# Patient Record
Sex: Male | Born: 1945 | Race: White | Hispanic: Yes | Marital: Married | Smoking: Never smoker
Health system: Southern US, Community
[De-identification: ages and names within clinical notes are randomized; demographics above are authoritative.]

## PROBLEM LIST (undated history)

## (undated) DIAGNOSIS — I10 Essential (primary) hypertension: Secondary | ICD-10-CM

## (undated) DIAGNOSIS — E7800 Pure hypercholesterolemia, unspecified: Secondary | ICD-10-CM

## (undated) DIAGNOSIS — R339 Retention of urine, unspecified: Secondary | ICD-10-CM

---

## 2019-01-09 ENCOUNTER — Emergency Department (HOSPITAL_BASED_OUTPATIENT_CLINIC_OR_DEPARTMENT_OTHER)
Admission: EM | Admit: 2019-01-09 | Discharge: 2019-01-09 | Disposition: A | Discharge status: Home / Self Care | Payer: Medicare Other | Attending: Emergency Medicine | Admitting: Emergency Medicine

## 2019-01-09 ENCOUNTER — Encounter (HOSPITAL_BASED_OUTPATIENT_CLINIC_OR_DEPARTMENT_OTHER): Payer: Self-pay | Admitting: Emergency Medicine

## 2019-01-09 ENCOUNTER — Emergency Department (HOSPITAL_COMMUNITY): Payer: Medicare Other

## 2019-01-09 ENCOUNTER — Emergency Department (HOSPITAL_BASED_OUTPATIENT_CLINIC_OR_DEPARTMENT_OTHER): Payer: Medicare Other

## 2019-01-09 ENCOUNTER — Other Ambulatory Visit: Payer: Self-pay

## 2019-01-09 DIAGNOSIS — Z79899 Other long term (current) drug therapy: Secondary | ICD-10-CM | POA: Insufficient documentation

## 2019-01-09 DIAGNOSIS — I10 Essential (primary) hypertension: Secondary | ICD-10-CM | POA: Diagnosis not present

## 2019-01-09 DIAGNOSIS — M5441 Lumbago with sciatica, right side: Secondary | ICD-10-CM | POA: Diagnosis not present

## 2019-01-09 DIAGNOSIS — R338 Other retention of urine: Secondary | ICD-10-CM | POA: Insufficient documentation

## 2019-01-09 DIAGNOSIS — M545 Low back pain: Secondary | ICD-10-CM | POA: Diagnosis present

## 2019-01-09 HISTORY — DX: Pure hypercholesterolemia, unspecified: E78.00

## 2019-01-09 HISTORY — DX: Essential (primary) hypertension: I10

## 2019-01-09 HISTORY — DX: Retention of urine, unspecified: R33.9

## 2019-01-09 LAB — URINALYSIS, ROUTINE W REFLEX MICROSCOPIC
Bilirubin Urine: NEGATIVE
Glucose, UA: NEGATIVE mg/dL
Hgb urine dipstick: NEGATIVE
Ketones, ur: NEGATIVE mg/dL
Leukocytes,Ua: NEGATIVE
Nitrite: NEGATIVE
Protein, ur: NEGATIVE mg/dL
Specific Gravity, Urine: 1.01 (ref 1.005–1.030)
pH: 7.5 (ref 5.0–8.0)

## 2019-01-09 LAB — BASIC METABOLIC PANEL
Anion gap: 7 (ref 5–15)
BUN: 13 mg/dL (ref 8–23)
CO2: 25 mmol/L (ref 22–32)
Calcium: 8.2 mg/dL — ABNORMAL LOW (ref 8.9–10.3)
Chloride: 109 mmol/L (ref 98–111)
Creatinine, Ser: 1.06 mg/dL (ref 0.61–1.24)
GFR calc Af Amer: >60 mL/min (ref >60)
GFR calc non Af Amer: >60 mL/min (ref >60)
Glucose, Bld: 101 mg/dL — ABNORMAL HIGH (ref 70–99)
Potassium: 3.6 mmol/L (ref 3.5–5.1)
Sodium: 141 mmol/L (ref 135–145)

## 2019-01-09 LAB — CBC WITH DIFFERENTIAL/PLATELET
Abs Immature Granulocytes: 0.01 10*3/uL (ref 0.00–0.07)
Basophils Absolute: 0 10*3/uL (ref 0.0–0.1)
Basophils Relative: 0 %
Eosinophils Absolute: 0.2 10*3/uL (ref 0.0–0.5)
Eosinophils Relative: 3 %
HCT: 40.3 % (ref 39.0–52.0)
Hemoglobin: 13.4 g/dL (ref 13.0–17.0)
Immature Granulocytes: 0 %
Lymphocytes Relative: 18 %
Lymphs Abs: 1.3 10*3/uL (ref 0.7–4.0)
MCH: 29.8 pg (ref 26.0–34.0)
MCHC: 33.3 g/dL (ref 30.0–36.0)
MCV: 89.8 fL (ref 80.0–100.0)
Monocytes Absolute: 0.6 10*3/uL (ref 0.1–1.0)
Monocytes Relative: 9 %
Neutro Abs: 5 10*3/uL (ref 1.7–7.7)
Neutrophils Relative %: 70 %
Platelets: 249 10*3/uL (ref 150–400)
RBC: 4.49 MIL/uL (ref 4.22–5.81)
RDW: 12.1 % (ref 11.5–15.5)
WBC: 7.1 10*3/uL (ref 4.0–10.5)
nRBC: 0 % (ref 0.0–0.2)

## 2019-01-09 MED ORDER — KETOROLAC TROMETHAMINE 15 MG/ML IJ SOLN
15.0000 mg | Freq: Once | INTRAMUSCULAR | Status: AC
  Administered 2019-01-09: 20:00:00 15 mg via INTRAVENOUS
  Filled 2019-01-09: qty 1

## 2019-01-09 MED ORDER — DEXAMETHASONE SODIUM PHOSPHATE 10 MG/ML IJ SOLN
10.0000 mg | Freq: Once | INTRAMUSCULAR | Status: AC
  Administered 2019-01-09: 23:00:00 10 mg via INTRAVENOUS
  Filled 2019-01-09: qty 1

## 2019-01-09 MED ORDER — FAMOTIDINE 20 MG PO TABS: 20.0000 mg | ORAL_TABLET | Freq: Every day | ORAL | 0 refills | Status: AC

## 2019-01-09 MED ORDER — METHYLPREDNISOLONE 4 MG PO TBPK: ORAL_TABLET | ORAL | 0 refills | Status: AC

## 2019-01-09 MED ORDER — MORPHINE SULFATE (PF) 4 MG/ML IV SOLN
4.0000 mg | Freq: Once | INTRAVENOUS | Status: AC
  Administered 2019-01-09: 4 mg via INTRAVENOUS
  Filled 2019-01-09: qty 1

## 2019-01-09 NOTE — ED Provider Notes (Signed)
[facility] EMERGENCY DEPARTMENT Provider Note   CSN: [id number] Arrival date & time: 01/09/19  1344    History   Chief Complaint Chief Complaint  Patient presents with   Back Pain    HPI Jason Dalton is a 73 y.o. male with PMHx HTN who presents to the ED complaining of sudden onset, intermittent, bilateral lower back pain radiating into right leg x 1 week. Pt also endorses urinary retention since onset of pain. He states he was mowing his lawn when he felt a sudden "twinge" in his lower back; he has been dealing with the pain since then and taking Norco that he has prescribed for arthritis with mild relief. Pt had a telemedicine visit with his PCP earlier this week who suggested steroids but patient felt mildly improved and declined; he reports that since then his pain has gotten worse prompting him to come to the ED today. No previous injuries to back. No previous surgeries to back. Denies fever, chills, saddle anesthesia, urinary or bowel incontinence. No hx chronic steroid use, malignancy, or IVDA.        Past Medical History:  Diagnosis Date   High cholesterol    Hypertension    Urinary retention     There are no active problems to display for this patient.      Home Medications    Prior to Admission medications   Medication Sig Start Date End Date Taking? Authorizing Provider  amLODipine (NORVASC) 2.5 MG tablet Take 2.5 mg by mouth daily.   Yes [provider]  aspirin EC 81 MG tablet Take 81 mg by mouth daily.   Yes [provider]  HYDROcodone-acetaminophen (NORCO/VICODIN) 5-325 MG tablet Take 1 tablet by mouth every 6 (six) hours as needed for moderate pain.   Yes [provider]  lisinopril (ZESTRIL) 10 MG tablet Take 10 mg by mouth daily.   Yes [provider]  tamsulosin (FLOMAX) 0.4 MG CAPS capsule Take 0.4 mg by mouth.   Yes [provider]    Family History No family history on file.  Social  History Social History   Tobacco Use   Smoking status: Never Smoker   Smokeless tobacco: Never Used  Substance Use Topics   Alcohol use: Not Currently   Drug use: Not on file     Allergies   Patient has no known allergies.   Review of Systems Review of Systems  Constitutional: Negative for chills and fever.  HENT: Negative for congestion.   Eyes: Negative for visual disturbance.  Respiratory: Negative for cough and shortness of breath.   Cardiovascular: Negative for chest pain.  Gastrointestinal: Negative for abdominal pain, diarrhea, nausea and vomiting.  Genitourinary: Positive for difficulty urinating.  Musculoskeletal: Positive for back pain. Negative for neck pain.  Skin: Negative for rash.  Neurological: Negative for weakness and numbness.     Physical Exam Updated Vital Signs BP (!) 184/103 (BP Location: Right Arm)    Pulse 62    Temp 98.4 F (36.9 C) (Oral)    Resp 18    Ht 5\' 9"  (1.753 m)    Wt 95.3 kg    SpO2 98%    BMI 31.01 kg/m   Physical Exam Vitals signs and nursing note reviewed.  Constitutional:      Appearance: He is not ill-appearing.  HENT:     Head: Normocephalic and atraumatic.  Eyes:     Conjunctiva/sclera: Conjunctivae normal.  Neck:     Musculoskeletal: Neck supple.  Cardiovascular:     Rate and Rhythm: Normal rate and regular rhythm.     Pulses: Normal pulses.  Pulmonary:     Effort: Pulmonary effort is normal.     Breath sounds: Normal breath sounds.  Abdominal:     Palpations: Abdomen is soft.     Tenderness: There is no abdominal tenderness. There is no guarding or rebound.  Musculoskeletal: Normal range of motion.     Comments: No C, T, or L spine midline tenderness to palpation.   Tenderness along bilateral lumbar paraspinal muscles; Negative SLR bilaterally; Strength 5/5 in BLEs; Sensation intact to dull and sharp touch; Reflexes intact; 2+ DP pulses bilaterally  Skin:    General: Skin is warm and dry.  Neurological:      Mental Status: He is alert.      ED Treatments / Results  Labs (all labs ordered are listed, but only abnormal results are displayed) Labs Reviewed  BASIC METABOLIC PANEL - Abnormal; Notable for the following components:      Result Value   Glucose, Bld 101 (*)    Calcium 8.2 (*)    All other components within normal limits  CBC WITH DIFFERENTIAL/PLATELET  URINALYSIS, ROUTINE W REFLEX MICROSCOPIC  BASIC METABOLIC PANEL    EKG None  Radiology Ct Lumbar Spine Wo Contrast  Result Date: 01/09/2019 CLINICAL DATA:  Back pain, cauda syndrome suspected. Bilateral lower back pain radiating into his buttocks x 1 week. Denies injury. No prior surgery or injections; pain started after pulling on lawnmower. Pain not traveling down legs. Dysuria. No h/o kidney stones EXAM: CT LUMBAR SPINE WITHOUT CONTRAST TECHNIQUE: Multidetector CT imaging of the lumbar spine was performed without intravenous contrast administration. Multiplanar CT image reconstructions were also generated. COMPARISON:  None. FINDINGS: Segmentation: 5 lumbar type vertebrae. Alignment: 2 mm retrolisthesis of L1 on L2. 4 mm retrolisthesis of L2 on L3. Remainder the alignment is anatomic. Vertebrae: Vertebral body heights are maintained. No acute fracture. No discitis or osteomyelitis. No aggressive osseous lesion. Paraspinal and other soft tissues: No acute paraspinal abnormality. Abdominal aortic atherosclerosis. Disc levels: Degenerative disease with disc height loss at T12-L1, L1-2, and L2-3. T12-L1: No disc protrusion. No foraminal stenosis. Mild bilateral facet arthropathy. L1-L2: Mild broad-based disc bulge. Moderate bilateral facet arthropathy. Moderate spinal stenosis. Moderate right foraminal stenosis. Mild left foraminal stenosis. L2-L3: No disc protrusion. Moderate bilateral facet arthropathy. Mild spinal stenosis. No foraminal stenosis. L3-L4: Mild broad-based disc bulge. Moderate bilateral facet arthropathy. Moderate spinal  stenosis. No foraminal stenosis. L4-L5: Broad-based disc bulge. Moderate bilateral facet arthropathy. Moderate spinal stenosis. Mild left foraminal stenosis. No right foraminal stenosis. L5-S1: Mild broad-based disc bulge. Severe left and moderate right facet arthropathy. No foraminal stenosis. IMPRESSION: 1.  No acute osseous injury of the lumbar spine. 2. Lumbar spine spondylosis as described above. 3.  Aortic Atherosclerosis (ICD10-I70.0). Electronically Signed   By: Elige Ko   On: 01/09/2019 15:12    Procedures Procedures (including critical care time)  Medications Ordered in ED Medications - No data to display   Initial Impression / Assessment and Plan / ED Course  I have reviewed the triage vital signs and the nursing notes.  Pertinent labs & imaging results that were available during my care of the patient were reviewed by me and considered in my medical decision making (see chart for details).    Pt is a 73 year old male who presents with bilateral lower back pain and worsening urinary retention x 1 week after  mowing his yard. No focal neuro deficits on exam; no other red flag complaints besides retention. Pt able to ambulate back to the ED without issue. Given presentation concern for cauda equina; will obtain CT L Spine at this time as MRI not availability at this facility; if negative will need to transfer patient for MRI. Baseline labs ordered as well as bladder scan.   Nursing staff informed that pt had 700 CCs of urine in his bladder; this was after he urinated for U/A sample; foley cath inserted in the ED today. Still awaiting CT results.   CT scan with moderate spinal stenosis as well as foraminal stenosis especially at L4-L5 and L5-S1; discussed case with Dr. Adela LankFloyd attending physician who agrees that pt probably needs an MRI at this point given worsening symptoms of urinary retention as well at back pain. [facility] and Dr. Fredderick PhenixBelfi is accepting; have placed orders for  both MRI L and T spine without contrast. Pt does have hardware in his left ankle; [facility] who states this will not be an issue. Pt to have wife drive him to [facility] immediately; he is in agreement with plan and stable for transport at this time.        Final Clinical Impressions(s) / ED Diagnoses   Final diagnoses:  Acute bilateral low back pain with right-sided sciatica  Acute urinary retention    ED Discharge Orders    None       Tanda RockersVenter, Zigmond Trela, PA-C 01/09/19 1752    Melene PlanFloyd, Dan, DO 01/09/19 2141

## 2019-01-09 NOTE — ED Notes (Signed)
To MRI

## 2019-01-09 NOTE — ED Provider Notes (Signed)
Pt accepted from [facility]. Briefly, 73 yo pleasant gentleman here w/ back pain, also urinary retention. Pain moderate, no LE weakness or numbness. CT scan c/f lumbar ddd. Pt also w/ history of prostatomegaly. No recent med changes. No saddle anesthesia. Plan was to send for MR to r/o cauda equina.  Labs reassuring - normal WBC, renal function. UA negative. Sx improving after foley placement.   MRI shows diffuse disc disease but cauda without acute abnormality. Discussed with Dr. Jordan Likes of [facility] - no acute intervention needed and does not explain his retention. I suspect the two are not directly related. Will leave foley in for his retention, start o medrol, and refer for outpt follow-up.   Shaune Pollack, MD 01/09/19 [phone]

## 2019-01-09 NOTE — ED Notes (Signed)
Pt denies injury, denies fever

## 2019-01-09 NOTE — ED Notes (Signed)
ED Provider at bedside. 

## 2019-01-09 NOTE — ED Notes (Signed)
Patient discharged/transferred to [facility] ED with foley catheter

## 2019-01-09 NOTE — ED Triage Notes (Signed)
Bilateral lower back pain radiating into his buttocks x 1 week. Denies injury.

## 2019-01-09 NOTE — ED Notes (Signed)
*  PT is transfer from [facility] here for a MRI.

## 2019-01-09 NOTE — Discharge Instructions (Addendum)
For your back:  Your MRI findings showed significant disc disease. For this, we're going to start you on Medrol dosepak, which is a steroid. Take the antacid I've prescribed with this to prevent heartburn/indigestion.   Take your Oxycodone as needed for pain.   Follow-up with a Spine Specialist and your primary doctor in the next 1-2 weeks. I've provided a number for Dr. Jordan Likes.  For your catheter: - It's difficult to say whether this caused your worsening pain or was made worse by your pain - The foley will need to stay in place for several days to help decrease inflammatoin - Take your other medications as prescribed - Do NOT pull on the foley as it has a ballloon inside your bladder - Call your urologist for removal early this week

## 2019-01-28 ENCOUNTER — Encounter (HOSPITAL_BASED_OUTPATIENT_CLINIC_OR_DEPARTMENT_OTHER): Payer: Self-pay | Admitting: Adult Health

## 2019-01-28 ENCOUNTER — Other Ambulatory Visit: Payer: Self-pay

## 2019-01-28 ENCOUNTER — Emergency Department (HOSPITAL_BASED_OUTPATIENT_CLINIC_OR_DEPARTMENT_OTHER)
Admission: EM | Admit: 2019-01-28 | Discharge: 2019-01-29 | Disposition: A | Discharge status: Home / Self Care | Payer: Medicare Other | Attending: Emergency Medicine | Admitting: Emergency Medicine

## 2019-01-28 DIAGNOSIS — Z79899 Other long term (current) drug therapy: Secondary | ICD-10-CM | POA: Insufficient documentation

## 2019-01-28 DIAGNOSIS — Z7982 Long term (current) use of aspirin: Secondary | ICD-10-CM | POA: Diagnosis not present

## 2019-01-28 DIAGNOSIS — I10 Essential (primary) hypertension: Secondary | ICD-10-CM | POA: Diagnosis not present

## 2019-01-28 DIAGNOSIS — R339 Retention of urine, unspecified: Secondary | ICD-10-CM | POA: Insufficient documentation

## 2019-01-28 DIAGNOSIS — M5440 Lumbago with sciatica, unspecified side: Secondary | ICD-10-CM | POA: Insufficient documentation

## 2019-01-28 LAB — URINALYSIS, ROUTINE W REFLEX MICROSCOPIC
Bilirubin Urine: NEGATIVE
Glucose, UA: NEGATIVE mg/dL
Hgb urine dipstick: NEGATIVE
Ketones, ur: NEGATIVE mg/dL
Leukocytes,Ua: NEGATIVE
Nitrite: NEGATIVE
Protein, ur: NEGATIVE mg/dL
Specific Gravity, Urine: 1.02 (ref 1.005–1.030)
pH: 6.5 (ref 5.0–8.0)

## 2019-01-28 LAB — BASIC METABOLIC PANEL
Anion gap: 7 (ref 5–15)
BUN: 14 mg/dL (ref 8–23)
CO2: 24 mmol/L (ref 22–32)
Calcium: 8.5 mg/dL — ABNORMAL LOW (ref 8.9–10.3)
Chloride: 100 mmol/L (ref 98–111)
Creatinine, Ser: 1.1 mg/dL (ref 0.61–1.24)
GFR calc Af Amer: >60 mL/min (ref >60)
GFR calc non Af Amer: >60 mL/min (ref >60)
Glucose, Bld: 105 mg/dL — ABNORMAL HIGH (ref 70–99)
Potassium: 4.1 mmol/L (ref 3.5–5.1)
Sodium: 131 mmol/L — ABNORMAL LOW (ref 135–145)

## 2019-01-28 LAB — CBC WITH DIFFERENTIAL/PLATELET
Abs Immature Granulocytes: 0.03 10*3/uL (ref 0.00–0.07)
Basophils Absolute: 0 10*3/uL (ref 0.0–0.1)
Basophils Relative: 0 %
Eosinophils Absolute: 0.3 10*3/uL (ref 0.0–0.5)
Eosinophils Relative: 3 %
HCT: 35.1 % — ABNORMAL LOW (ref 39.0–52.0)
Hemoglobin: 12.2 g/dL — ABNORMAL LOW (ref 13.0–17.0)
Immature Granulocytes: 0 %
Lymphocytes Relative: 15 %
Lymphs Abs: 1.2 10*3/uL (ref 0.7–4.0)
MCH: 30.5 pg (ref 26.0–34.0)
MCHC: 34.8 g/dL (ref 30.0–36.0)
MCV: 87.8 fL (ref 80.0–100.0)
Monocytes Absolute: 0.9 10*3/uL (ref 0.1–1.0)
Monocytes Relative: 11 %
Neutro Abs: 6 10*3/uL (ref 1.7–7.7)
Neutrophils Relative %: 71 %
Platelets: 221 10*3/uL (ref 150–400)
RBC: 4 MIL/uL — ABNORMAL LOW (ref 4.22–5.81)
RDW: 11.9 % (ref 11.5–15.5)
WBC: 8.5 10*3/uL (ref 4.0–10.5)
nRBC: 0 % (ref 0.0–0.2)

## 2019-01-28 MED ORDER — SODIUM CHLORIDE 0.9 % IV BOLUS
500.0000 mL | Freq: Once | INTRAVENOUS | Status: AC
  Administered 2019-01-28: 500 mL via INTRAVENOUS

## 2019-01-28 MED ORDER — MORPHINE SULFATE (PF) 4 MG/ML IV SOLN
4.0000 mg | Freq: Once | INTRAVENOUS | Status: AC
  Administered 2019-01-28: 4 mg via INTRAVENOUS
  Filled 2019-01-28: qty 1

## 2019-01-28 NOTE — ED Notes (Signed)
ED Provider at bedside. 

## 2019-01-28 NOTE — ED Provider Notes (Signed)
[facility] EMERGENCY DEPARTMENT Provider Note   CSN: [id number] Arrival date & time: 01/28/19  2150    History   Chief Complaint Chief Complaint  Patient presents with  . Urinary Retention    HPI Jason Dalton is a 73 y.o. male.     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  Patient has been drinking fluids well throughout the day noticed for 3 hours had minimal output.  Patient's had back pain since he was diagnosed and Foley was placed.  Patient had MRI recently showing significant spinal stenosis and has his primary doctor setting up appointment with neurosurgery.  Patient denies weakness or numbness in his lower extremities.  Patient has no fevers.  No other new medications.     Past Medical History:  Diagnosis Date  . High cholesterol   . Hypertension   . Urinary retention     There are no active problems to display for this patient.   History reviewed. No pertinent surgical history.      Home Medications    Prior to Admission medications   Medication Sig Start Date End Date Taking? Authorizing Provider  amLODipine (NORVASC) 2.5 MG tablet Take 2.5 mg by mouth daily.    [provider]  aspirin EC 81 MG tablet Take 81 mg by mouth daily.    [provider]  famotidine (PEPCID) 20 MG tablet Take 1 tablet (20 mg total) by mouth daily for 10 days. While taking steroids 01/09/19 01/19/19  Shaune PollackIsaacs, Cameron, MD  HYDROcodone-acetaminophen (NORCO/VICODIN) 5-325 MG tablet Take 1 tablet by mouth every 6 (six) hours as needed for moderate pain.    [provider]  lisinopril (ZESTRIL) 10 MG tablet Take 10 mg by mouth daily.    [provider]  methylPREDNISolone (MEDROL DOSEPAK) 4 MG TBPK tablet Take as directed on packaging 01/09/19   Shaune PollackIsaacs, Cameron, MD  tamsulosin  (FLOMAX) 0.4 MG CAPS capsule Take 0.4 mg by mouth.    [provider]    Family History History reviewed. No pertinent family history.  Social History Social History   Tobacco Use  . Smoking status: Never Smoker  . Smokeless tobacco: Never Used  Substance Use Topics  . Alcohol use: Not Currently  . Drug use: Not on file     Allergies   Patient has no known allergies.   Review of Systems Review of Systems  Constitutional: Negative for chills and fever.  HENT: Negative for congestion.   Eyes: Negative for visual disturbance.  Respiratory: Negative for shortness of breath.   Cardiovascular: Negative for chest pain.  Gastrointestinal: Negative for abdominal pain and vomiting.  Genitourinary: Positive for difficulty urinating. Negative for dysuria and flank pain.  Musculoskeletal: Positive for back pain (worse with movement). Negative for neck pain and neck stiffness.  Skin: Negative for rash.  Neurological: Negative for weakness, light-headedness, numbness and headaches.     Physical Exam Updated Vital Signs BP (!) 179/96   Pulse (!) 54   Temp 98.2 F (36.8 C) (Oral)   Resp 18   Ht 5\' 9"  (1.753 m)   Wt 92 kg   SpO2 98%   BMI 29.95 kg/m   Physical Exam Vitals signs and nursing note reviewed.  Constitutional:      Appearance: He is well-developed.  HENT:     Head: Normocephalic and atraumatic.  Eyes:     General:        Right eye: No discharge.        Left eye: No discharge.     Conjunctiva/sclera: Conjunctivae normal.  Neck:     Musculoskeletal: Normal range of motion and neck supple.     Trachea: No tracheal deviation.  Cardiovascular:     Rate and Rhythm: Normal rate and regular rhythm.  Pulmonary:     Effort: Pulmonary effort is normal.     Breath sounds: Normal breath sounds.  Abdominal:     General: There is no distension.     Palpations: Abdomen is soft.     Tenderness: There is no abdominal tenderness. There is no guarding.   Musculoskeletal:        General: Tenderness present.     Comments: Patient has tenderness midline paraspinal lower lumbar region.  Skin:    General: Skin is warm.     Findings: No rash.  Neurological:     Mental Status: He is alert and oriented to person, place, and time.     GCS: GCS eye subscore is 4. GCS verbal subscore is 5. GCS motor subscore is 6.     Comments: Patient has 5+ strength with flexion-extension at major joints lower extremities bilateral.  Sensation intact to major nerves.  2+ reflexes lower extremities.      ED Treatments / Results  Labs (all labs ordered are listed, but only abnormal results are displayed) Labs Reviewed  URINE CULTURE  URINALYSIS, ROUTINE W REFLEX MICROSCOPIC  CBC WITH DIFFERENTIAL/PLATELET  BASIC METABOLIC PANEL    EKG None  Radiology No results found.  Procedures Procedures (including critical care time)  Medications Ordered in ED Medications  sodium chloride 0.9 % bolus 500 mL (500 mLs Intravenous New Bag/Given 01/28/19 2253)  morphine 4 MG/ML injection 4 mg (4 mg Intravenous Given 01/28/19 2303)     Initial Impression / Assessment and Plan / ED Course  I have reviewed the triage vital signs and the nursing notes.  Pertinent labs & imaging results that were available during my care of the patient were reviewed by me and considered in my medical decision making (see chart for details).       Patient presents with decreased urine output and mild worsening back pain since diagnosis of spinal stenosis and disc herniations.  Patient has no weakness or numbness in his lower extremities. Bladder scan 100s.   Patient is producing urine in his Foley.  IV fluids/oral fluids ordered, blood work to check kidney function and urinalysis to look for urine infection.  Patient at this time appears stable to follow-up with a specialist as previously arranged.  Blood and urine tests pending at sign out.   Final Clinical Impressions(s) / ED  Diagnoses   Final diagnoses:  Urinary retention  Back pain of lumbar region with sciatica    ED Discharge Orders    None       [name], MD 01/28/19 2306

## 2019-01-28 NOTE — ED Notes (Signed)
Small amount of urine noted in leg bag.

## 2019-01-28 NOTE — ED Triage Notes (Signed)
Presents with urinary retention, pt has not drainage in his foley bag since 7 pm today. He diud flush the catheter with no relief.

## 2019-01-28 NOTE — Discharge Instructions (Signed)
Follow-up as directed with your urologist, neurosurgery and primary doctor. If you do not produce urine for over 6 hours despite drinking fluids please see a clinician. If you develop fevers, weakness or numbness in your legs or cannot feel around your scrotum or anus region please see a clinician or come to the emergency room.

## 2019-01-30 LAB — URINE CULTURE: Culture: NO GROWTH

## 2020-01-28 IMAGING — MR MRI LUMBAR SPINE WITHOUT CONTRAST
4 of 5 series · 28 of 48 positions shown · non-contrast
Comparison: None.

CLINICAL DATA: 72 y.o. male with PMHx HTN who presents to the ED
complaining of sudden onset, intermittent, bilateral lower back pain
radiating into right leg x 1 week. Pt also endorses urinary
retention since onset of pain.

EXAM:
MRI LUMBAR SPINE WITHOUT CONTRAST
TECHNIQUE: Multiplanar, multisequence MR imaging of the lumbar spine was
performed. No intravenous contrast was administered.

[Series 8: T2 · sagittal · 4.0mm · 0.73mm/px · 8 of 20 slices shown (1 of 2)]
[im 1/20]
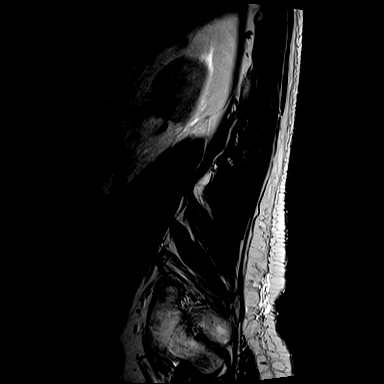
[im 3/20]
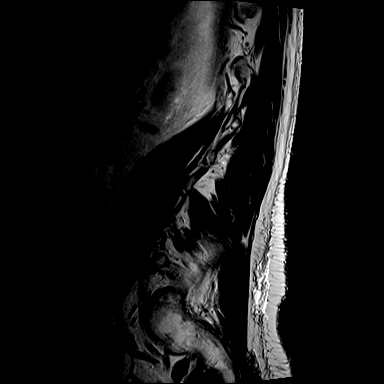
[im 6/20]
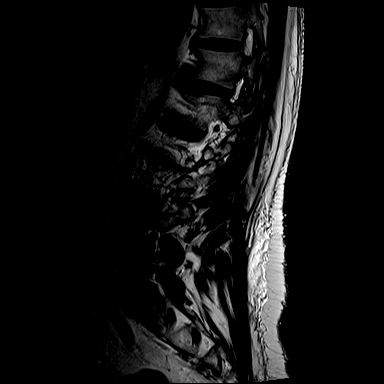
[im 9/20]
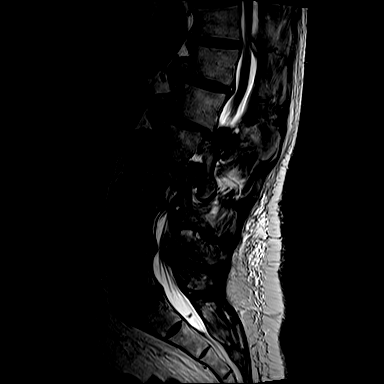
[im 11/20]
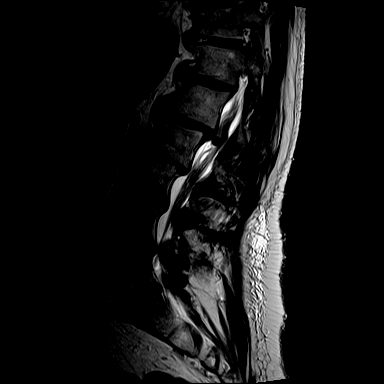
[im 14/20]
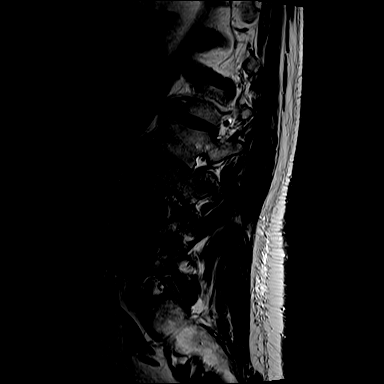
[im 17/20]
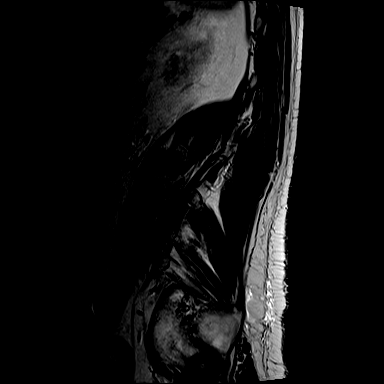
[im 20/20]
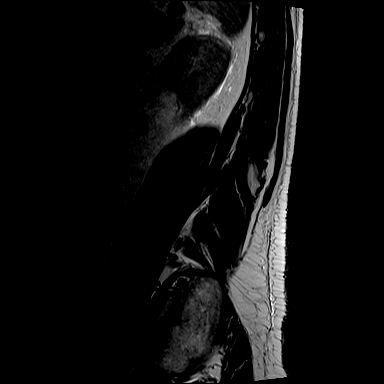

[Series 10: T1 · sagittal · 4.0mm · 0.88mm/px · 8 of 20 slices shown (1 of 2)]
[im 1/20]
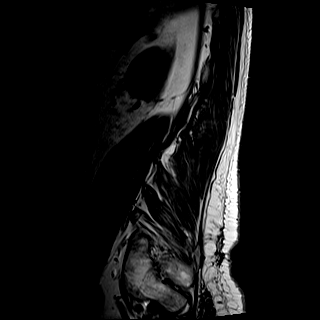
[im 3/20]
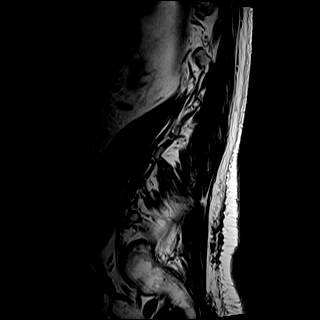
[im 6/20]
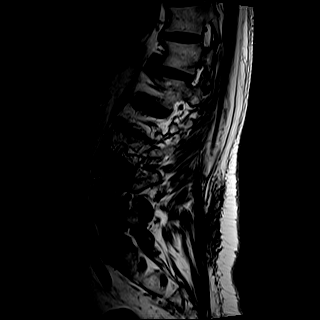
[im 9/20]
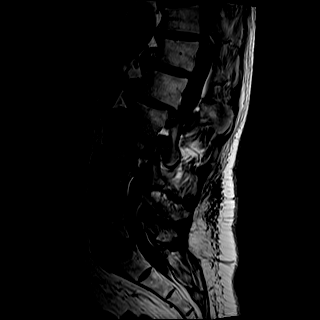
[im 11/20]
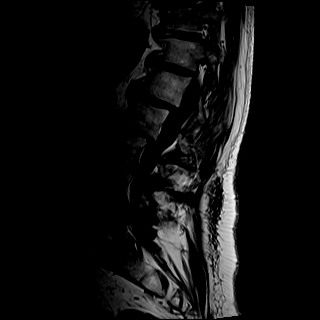
[im 14/20]
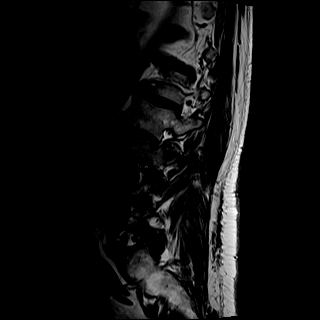
[im 17/20]
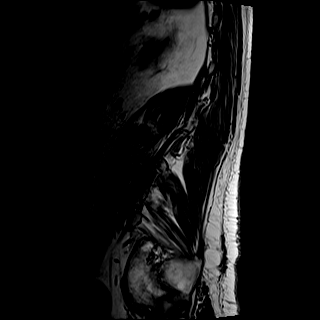
[im 20/20]
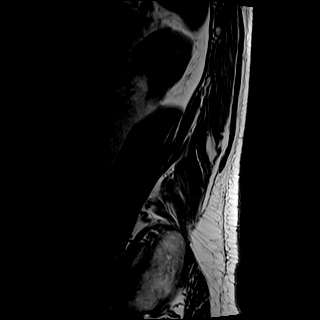

[Series 11: T2 · axial · 4.0mm · 0.57mm/px · z∈[-465,-283]mm · 9 of 32 slices shown (2 of 2)]
[im 1/32]
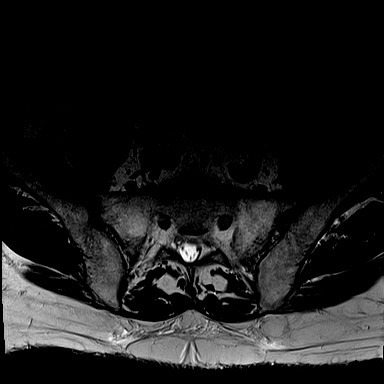
[im 6/32]
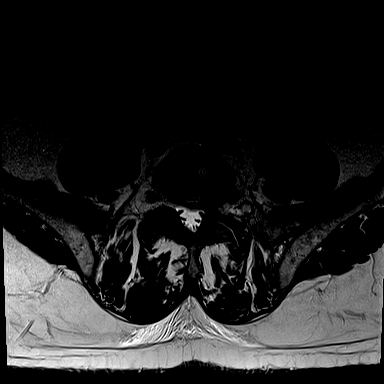
[im 9/32]
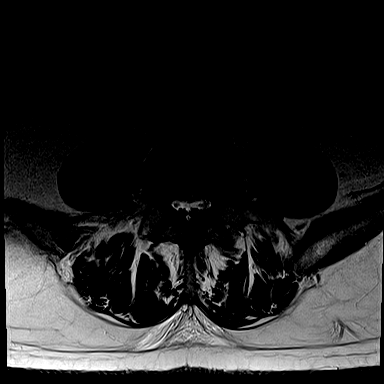
[im 15/32]
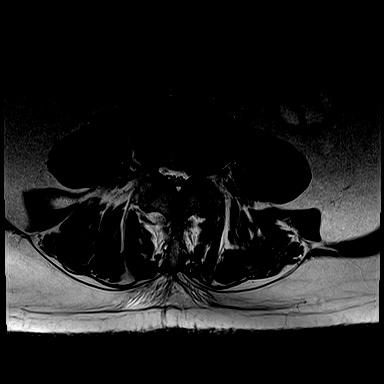
[im 17/32]
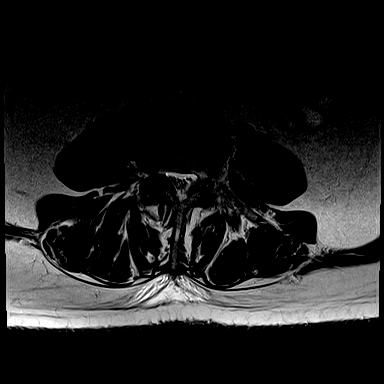
[im 23/32]
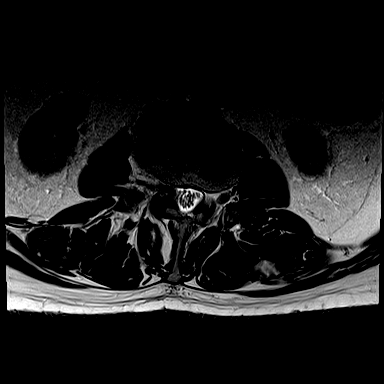
[im 26/32]
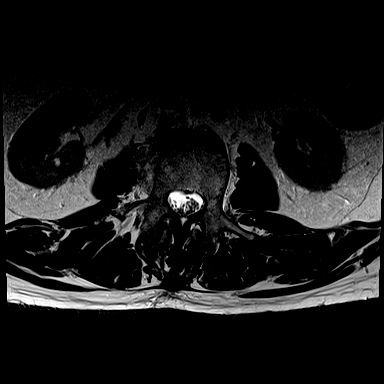
[im 29/32]
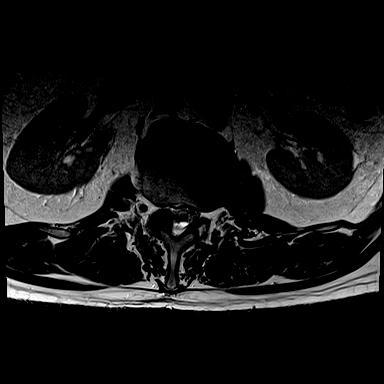
[im 32/32]
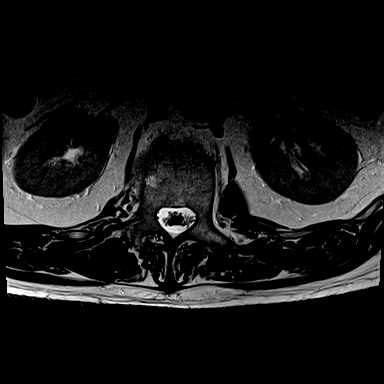

[Series 12: T1 · axial · 4.0mm · 0.34mm/px · z∈[-435,-298]mm · 3 of 32 slices shown (2 of 2)]
[im 6/32]
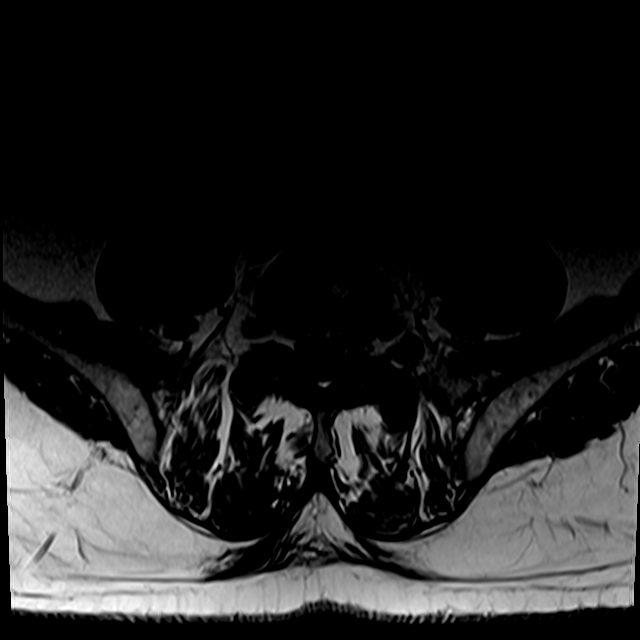
[im 17/32]
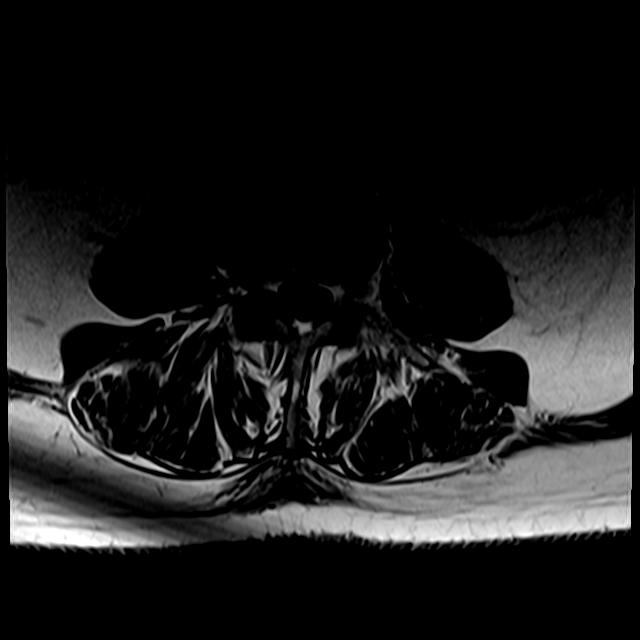
[im 29/32]
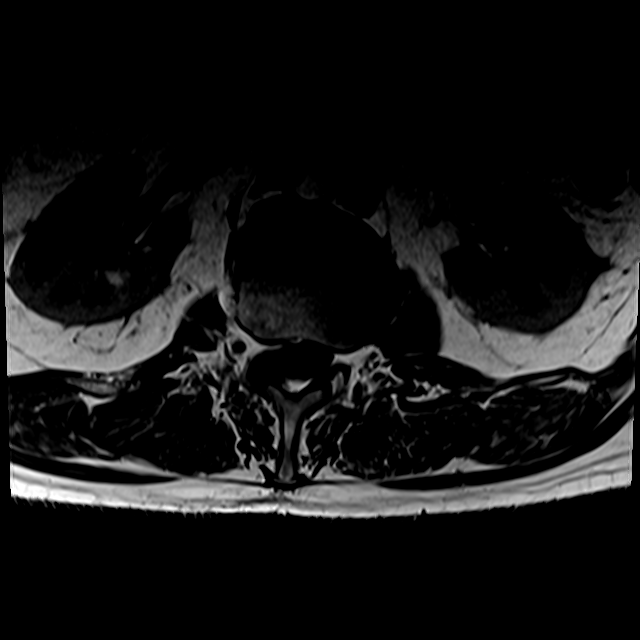

[28 of 48 positions shown; findings below may reference images not displayed]

FINDINGS: Segmentation:  Standard.

Alignment:  3 mm retrolisthesis of L1 on L2 and L2 on L3.

Vertebrae:  No fracture, evidence of discitis, or bone lesion.

Conus medullaris and cauda equina: Conus extends to the L1 level.
Conus and cauda equina appear normal.

Paraspinal and other soft tissues: No acute paraspinal abnormality.

Disc levels:

Disc spaces: Degenerative disc disease with disc height loss at
L1-2, L2-3.

T12-L1: Mild broad-based disc bulge. No evidence of neural foraminal
stenosis. No central canal stenosis.

L1-L2: Broad-based disc bulge with a small right paracentral disc
protrusion. Moderate bilateral facet arthropathy with right facet
hypertrophic changes focally extending into the spinal canal and
deforming the thecal sac. Moderate spinal stenosis. Mild right
foraminal stenosis. No left foraminal stenosis.

L2-L3: Broad-based disc bulge. Moderate bilateral facet arthropathy.
Mild-moderate spinal stenosis. Mild right foraminal narrowing. No
left foraminal narrowing.

L3-L4: Broad-based disc bulge. Moderate bilateral facet arthropathy.
Moderate spinal stenosis. Bilateral lateral recess stenosis. No
foraminal stenosis.

L4-L5: Broad-based disc bulge. Moderate bilateral facet arthropathy.
Bilateral lateral recess stenosis and severe spinal stenosis.
Moderate left foraminal stenosis. No right foraminal stenosis.

L5-S1: Broad-based disc bulge. Severe left facet arthropathy and
mild right facet arthropathy. 3 mm left facet extra-spinal synovial
cyst. No evidence of neural foraminal stenosis. No central canal
stenosis.
IMPRESSION: 1. At L4-5 there is a broad-based disc bulge. Moderate bilateral
facet arthropathy. Bilateral lateral recess stenosis and severe
spinal stenosis. Moderate left foraminal stenosis. No right
foraminal stenosis.
2. At L1-2 there is a broad-based disc bulge with a small right
paracentral disc protrusion. Moderate bilateral facet arthropathy
with right facet hypertrophic changes focally extending into the
spinal canal and deforming the thecal sac. Moderate spinal stenosis.
Mild right foraminal stenosis.
3. At L3-4 there is a broad-based disc bulge. Moderate bilateral
facet arthropathy. Moderate spinal stenosis. Bilateral lateral
recess stenosis. No foraminal stenosis.
4. At L2-3 there is a broad-based disc bulge. Moderate bilateral
facet arthropathy. Mild-moderate spinal stenosis. Mild right
foraminal narrowing.

## 2022-07-24 ENCOUNTER — Emergency Department (HOSPITAL_BASED_OUTPATIENT_CLINIC_OR_DEPARTMENT_OTHER): Payer: Medicare Other

## 2022-07-24 ENCOUNTER — Emergency Department (HOSPITAL_BASED_OUTPATIENT_CLINIC_OR_DEPARTMENT_OTHER)
Admission: EM | Admit: 2022-07-24 | Discharge: 2022-07-24 | Disposition: A | Discharge status: Home / Self Care | Payer: Medicare Other | Attending: Emergency Medicine | Admitting: Emergency Medicine

## 2022-07-24 ENCOUNTER — Encounter (HOSPITAL_BASED_OUTPATIENT_CLINIC_OR_DEPARTMENT_OTHER): Payer: Self-pay | Admitting: Pediatrics

## 2022-07-24 ENCOUNTER — Other Ambulatory Visit: Payer: Self-pay

## 2022-07-24 DIAGNOSIS — K5120 Ulcerative (chronic) proctitis without complications: Secondary | ICD-10-CM | POA: Diagnosis not present

## 2022-07-24 DIAGNOSIS — I10 Essential (primary) hypertension: Secondary | ICD-10-CM | POA: Insufficient documentation

## 2022-07-24 DIAGNOSIS — Z7982 Long term (current) use of aspirin: Secondary | ICD-10-CM | POA: Diagnosis not present

## 2022-07-24 DIAGNOSIS — R1084 Generalized abdominal pain: Secondary | ICD-10-CM | POA: Diagnosis present

## 2022-07-24 DIAGNOSIS — K5900 Constipation, unspecified: Secondary | ICD-10-CM | POA: Insufficient documentation

## 2022-07-24 DIAGNOSIS — K6289 Other specified diseases of anus and rectum: Secondary | ICD-10-CM

## 2022-07-24 DIAGNOSIS — Z79899 Other long term (current) drug therapy: Secondary | ICD-10-CM | POA: Diagnosis not present

## 2022-07-24 LAB — CBC WITH DIFFERENTIAL/PLATELET
Abs Immature Granulocytes: 0.04 10*3/uL (ref 0.00–0.07)
Basophils Absolute: 0 10*3/uL (ref 0.0–0.1)
Basophils Relative: 0 %
Eosinophils Absolute: 0 10*3/uL (ref 0.0–0.5)
Eosinophils Relative: 0 %
HCT: 37.8 % — ABNORMAL LOW (ref 39.0–52.0)
Hemoglobin: 13.2 g/dL (ref 13.0–17.0)
Immature Granulocytes: 0 %
Lymphocytes Relative: 5 %
Lymphs Abs: 0.5 10*3/uL — ABNORMAL LOW (ref 0.7–4.0)
MCH: 30.8 pg (ref 26.0–34.0)
MCHC: 34.9 g/dL (ref 30.0–36.0)
MCV: 88.3 fL (ref 80.0–100.0)
Monocytes Absolute: 0.5 10*3/uL (ref 0.1–1.0)
Monocytes Relative: 5 %
Neutro Abs: 9.9 10*3/uL — ABNORMAL HIGH (ref 1.7–7.7)
Neutrophils Relative %: 90 %
Platelets: 248 10*3/uL (ref 150–400)
RBC: 4.28 MIL/uL (ref 4.22–5.81)
RDW: 11.9 % (ref 11.5–15.5)
WBC: 11.1 10*3/uL — ABNORMAL HIGH (ref 4.0–10.5)
nRBC: 0 % (ref 0.0–0.2)

## 2022-07-24 LAB — COMPREHENSIVE METABOLIC PANEL
ALT: 15 U/L (ref 0–44)
AST: 20 U/L (ref 15–41)
Albumin: 3.8 g/dL (ref 3.5–5.0)
Alkaline Phosphatase: 58 U/L (ref 38–126)
Anion gap: 8 (ref 5–15)
BUN: 15 mg/dL (ref 8–23)
CO2: 24 mmol/L (ref 22–32)
Calcium: 9.2 mg/dL (ref 8.9–10.3)
Chloride: 108 mmol/L (ref 98–111)
Creatinine, Ser: 1.04 mg/dL (ref 0.61–1.24)
GFR, Estimated: >60 mL/min (ref >60)
Glucose, Bld: 127 mg/dL — ABNORMAL HIGH (ref 70–99)
Potassium: 4.2 mmol/L (ref 3.5–5.1)
Sodium: 140 mmol/L (ref 135–145)
Total Bilirubin: 0.9 mg/dL (ref 0.3–1.2)
Total Protein: 6.9 g/dL (ref 6.5–8.1)

## 2022-07-24 MED ORDER — IOHEXOL 300 MG/ML  SOLN
100.0000 mL | Freq: Once | INTRAMUSCULAR | Status: AC | PRN
  Administered 2022-07-24: 100 mL via INTRAVENOUS

## 2022-07-24 NOTE — ED Triage Notes (Signed)
C/O difficulty defecating; states he feels it but won't come out. Last BM 3 days ago.

## 2022-07-24 NOTE — ED Notes (Signed)
Pt had a pile of feces after he was able to move his bowels in the bed. He was cleaned up and ambulated to the bathroom to clean up further. New linens and gown given and pt returned to his room without assistance.

## 2022-07-24 NOTE — ED Provider Notes (Addendum)
[facility] EMERGENCY DEPARTMENT Provider Note   CSN: [id number] Arrival date & time: 07/24/22  1013     History  Chief Complaint  Patient presents with   Constipation    Jason Dalton is a 76 y.o. male.  Patient with a complaint of generalized abdominal pain but more so in the rectal area.  Says his last bowel movement was 3 to 4 days ago.  Patient had trouble with constipation in the past had an upper respiratory infection a few weeks ago and thinks that he stopped using MiraLAX at that time.  Past medical history sniffing for hypertension high cholesterol urinary retention.  Patient denies any fevers.  No nausea or vomiting.  No diarrhea.  No blood in the bowel movements.  Patient never used tobacco products.       Home Medications Prior to Admission medications   Medication Sig Start Date End Date Taking? Authorizing Provider  amLODipine (NORVASC) 2.5 MG tablet Take 2.5 mg by mouth daily.    [provider]  aspirin EC 81 MG tablet Take 81 mg by mouth daily.    [provider]  famotidine (PEPCID) 20 MG tablet Take 1 tablet (20 mg total) by mouth daily for 10 days. While taking steroids 01/09/19 01/19/19  Shaune Pollack, MD  HYDROcodone-acetaminophen (NORCO/VICODIN) 5-325 MG tablet Take 1 tablet by mouth every 6 (six) hours as needed for moderate pain.    [provider]  lisinopril (ZESTRIL) 10 MG tablet Take 10 mg by mouth daily.    [provider]  methylPREDNISolone (MEDROL DOSEPAK) 4 MG TBPK tablet Take as directed on packaging 01/09/19   Shaune Pollack, MD  tamsulosin (FLOMAX) 0.4 MG CAPS capsule Take 0.4 mg by mouth.    [provider]      Allergies    Patient has no known allergies.    Review of Systems   Review of Systems  Constitutional:  Negative for chills and fever.  HENT:  Negative for rhinorrhea and sore throat.   Eyes:  Negative for visual disturbance.  Respiratory:  Negative for cough and shortness of  breath.   Cardiovascular:  Negative for chest pain and leg swelling.  Gastrointestinal:  Positive for abdominal pain and rectal pain. Negative for blood in stool, diarrhea, nausea and vomiting.  Genitourinary:  Negative for dysuria.  Musculoskeletal:  Negative for back pain and neck pain.  Skin:  Negative for rash.  Neurological:  Negative for dizziness, light-headedness and headaches.  Hematological:  Does not bruise/bleed easily.  Psychiatric/Behavioral:  Negative for confusion.     Physical Exam Updated Vital Signs BP (!) 146/73 (BP Location: Right Arm)   Pulse 72   Temp 97.7 F (36.5 C) (Oral)   Resp 17   Ht 1.702 m (5\' 7" )   Wt 94.3 kg   SpO2 95%   BMI 32.58 kg/m  Physical Exam Vitals and nursing note reviewed.  Constitutional:      General: He is not in acute distress.    Appearance: Normal appearance. He is well-developed.  HENT:     Head: Normocephalic and atraumatic.  Eyes:     Conjunctiva/sclera: Conjunctivae normal.     Pupils: Pupils are equal, round, and reactive to light.  Cardiovascular:     Rate and Rhythm: Normal rate and regular rhythm.     Heart sounds: No murmur heard. Pulmonary:     Effort: Pulmonary effort is normal. No respiratory distress.     Breath sounds: Normal breath sounds.  Abdominal:     Palpations: Abdomen is soft.     Tenderness: There is no abdominal tenderness.  Genitourinary:    Comments: Perianal part of the rectal exam no fissures.  No prolapsed internal hemorrhoids no thrombosed external hemorrhoids.  No significant skin tags.  Large amount of stool in the rectal vault but also of some liquid as well.  Some of the stool little bit hard other part of the stools pretty soft.  What really called this an impaction.  But tried to pull out what we could.  No evidence of any gross blood. Musculoskeletal:        General: No swelling.     Cervical back: Neck supple.  Skin:    General: Skin is warm and dry.     Capillary Refill: Capillary  refill takes less than 2 seconds.  Neurological:     General: No focal deficit present.     Mental Status: He is alert and oriented to person, place, and time.     Cranial Nerves: No cranial nerve deficit.     Sensory: No sensory deficit.     Motor: No weakness.  Psychiatric:        Mood and Affect: Mood normal.     ED Results / Procedures / Treatments   Labs (all labs ordered are listed, but only abnormal results are displayed) Labs Reviewed  COMPREHENSIVE METABOLIC PANEL  CBC WITH DIFFERENTIAL/PLATELET    EKG None  Radiology No results found.  Procedures Procedures    Medications Ordered in ED Medications - No data to display  ED Course/ Medical Decision Making/ A&P                           Medical Decision Making Amount and/or Complexity of Data Reviewed Labs: ordered. Radiology: ordered.  Risk Prescription drug management.   Based on the degree of abdominal discomfort in the rectal exam not seeming to fit a significant rectal impaction.  We will go ahead and get CT scan of the abdomen pelvis to rule out any other obstruction or pathology.  If negative will have patient hit MiraLAX heavily.  And have already talked to him about that.  Labs are pending.  Patient's labs nonsignificant or maladies.  Complete metabolic panel normal including renal function.  CBC mild leukocytosis 11.1.  Platelets normal 248.  Patient had large bowel movement prior to going to CT.  So probably decompressed most of the clinically probable constipation.  CT scan done just to rule out any other acute abnormalities.  Waiting on results for that.  CT scan without evidence of any significant obstruction but did show some evidence of proctitis.  Patient's had a GI doctor in the past but is not percent sure.  He thinks in [place] area.  He will follow-up with them also gave him Dr. Bosie Clos on-call for [place] to follow-up if necessary.  Also has an appointment with his primary care doctor  in the next few days.  But most likely needs colonoscopy or sigmoidoscopy.  For further evaluation.  We will recommend that he uses MiraLAX at least twice a day in the meantime.   Final Clinical Impression(s) / ED Diagnoses Final diagnoses:  Constipation, unspecified constipation type  Generalized abdominal pain    Rx / DC Orders ED Discharge Orders     None         Vanetta Mulders, MD 07/24/22 1313    Vanetta Mulders, MD  07/24/22 1521    Vanetta Mulders, MD 07/24/22 1545

## 2022-07-24 NOTE — ED Notes (Signed)
Patient transported to CT 

## 2022-07-24 NOTE — ED Notes (Signed)
Pt reports abdominal pain, unable to sit comfortably, inquiring about how long to wait for doctor.

## 2022-07-24 NOTE — Discharge Instructions (Addendum)
I recommend using MiraLAX 1-4 times a day.  CT scan did not show any significant obstruction.  But did show some inflammation in the rectal area.  This needs to be followed up with colonoscopy or sigmoidoscopy.  Make an appointment follow-up with your GI doctors in the [facility] area or either call her GI doctor on-call information provided above.  Keep your appointment they have your primary care doctor in the next few days.  Return for any new or worse symptoms.
